# Patient Record
Sex: Female | Born: 2000 | Race: White | Hispanic: No | Marital: Single
Health system: Southern US, Community
[De-identification: ages and names within clinical notes are randomized; demographics above are authoritative.]

## PROBLEM LIST (undated history)

## (undated) DIAGNOSIS — K900 Celiac disease: Secondary | ICD-10-CM

## (undated) HISTORY — DX: Celiac disease: K90.0

---
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
## 2018-05-18 IMAGING — DX DG ABDOMEN 1V
1 series · 1 of 1 positions shown · non-contrast
Comparison: None.

CLINICAL DATA: Abdominal pain

EXAM:
ABDOMEN - 1 VIEW

[dg abd 1 view]
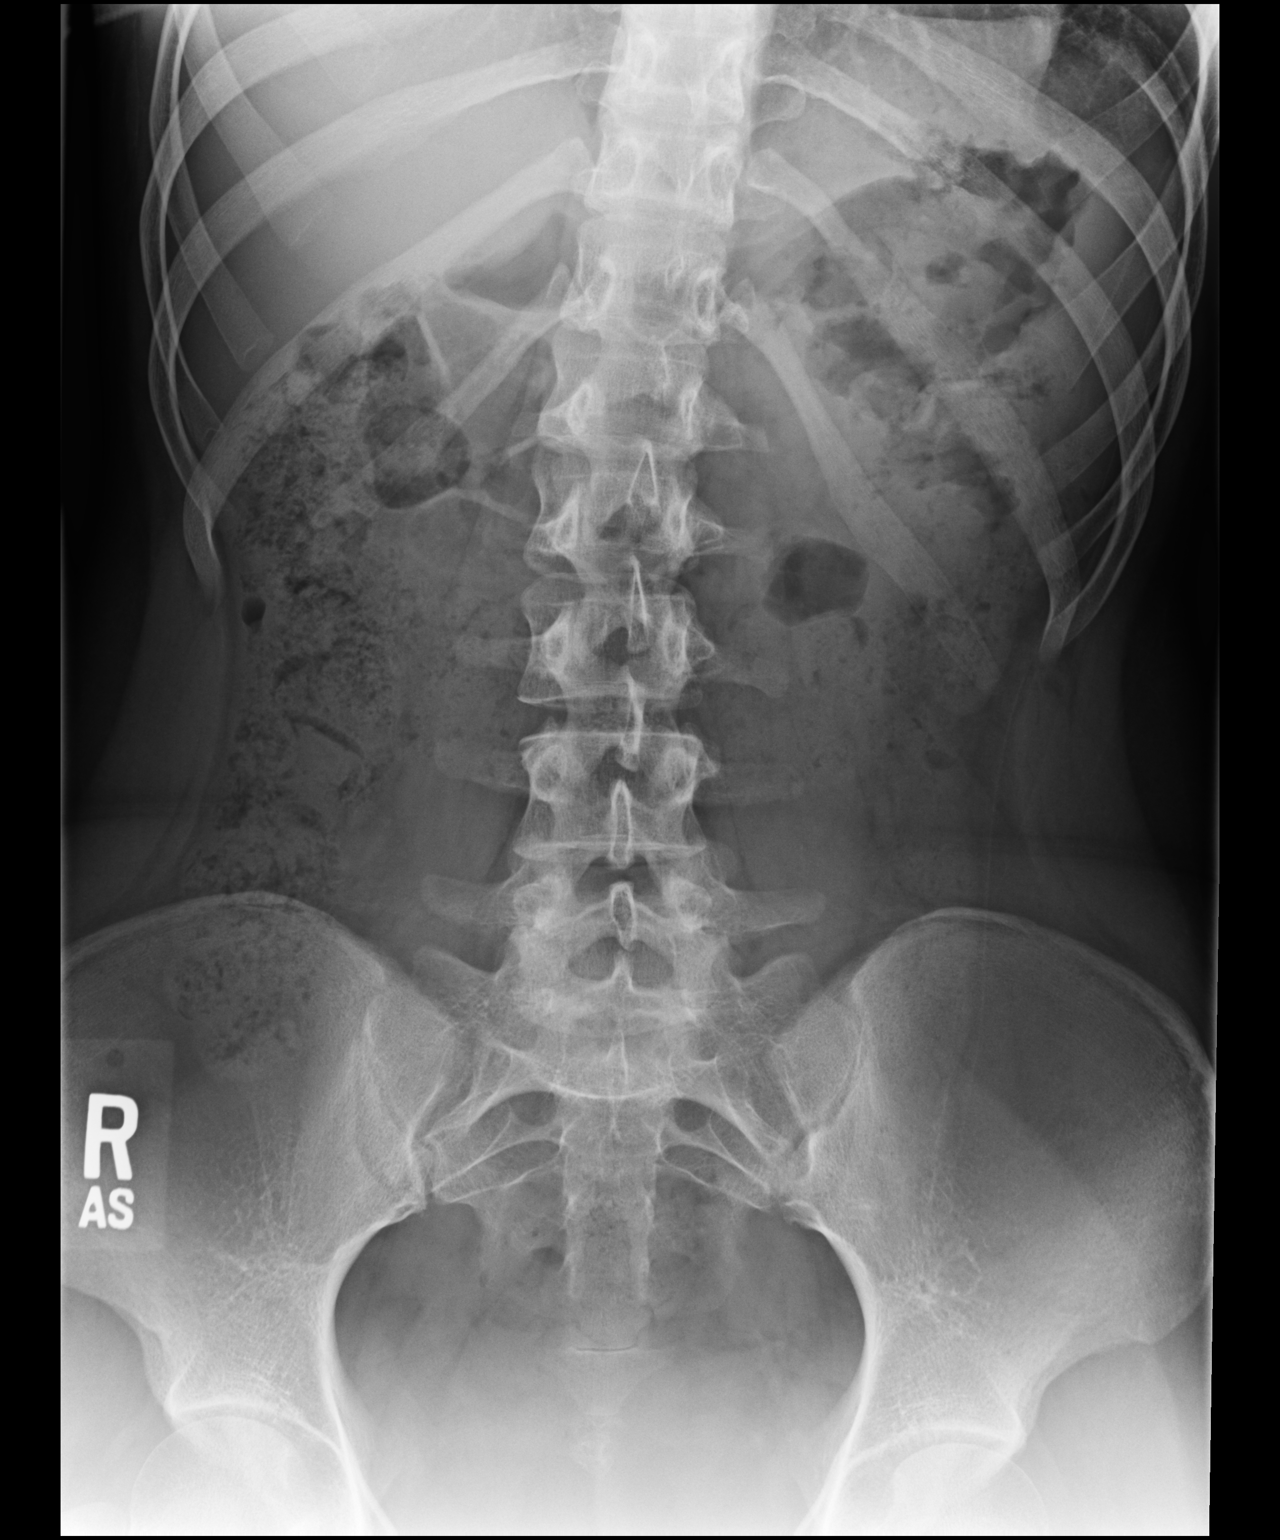

[1 of 1 positions shown; findings below may reference images not displayed]

FINDINGS: The bowel gas pattern is normal. No radio-opaque calculi or other
significant radiographic abnormality are seen.
IMPRESSION: No acute abnormality noted.

## 2019-08-13 ENCOUNTER — Ambulatory Visit: Payer: BC Managed Care – PPO
